# Patient Record
Sex: Female | Born: 1995 | Race: Black or African American | Hispanic: No | Marital: Single | Smoking: Never smoker
Health system: Southern US, Community
[De-identification: ages and names within clinical notes are randomized; demographics above are authoritative.]

## PROBLEM LIST (undated history)

## (undated) DIAGNOSIS — T7840XA Allergy, unspecified, initial encounter: Secondary | ICD-10-CM

## (undated) HISTORY — DX: Allergy, unspecified, initial encounter: T78.40XA

---
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
## 2013-04-03 ENCOUNTER — Ambulatory Visit (INDEPENDENT_AMBULATORY_CARE_PROVIDER_SITE_OTHER): Payer: 59 | Admitting: Physician Assistant

## 2013-04-03 VITALS — BP 115/78 | HR 82 | Temp 98.4°F | Resp 16 | Ht 67.0 in | Wt 139.0 lb

## 2013-04-03 DIAGNOSIS — Z0000 Encounter for general adult medical examination without abnormal findings: Secondary | ICD-10-CM

## 2013-04-03 NOTE — Progress Notes (Signed)
Patient ID: Joann Nelson MRN: [id number], DOB: [birth year], 17 y.o. Date of Encounter: 04/03/2013, 10:29 AM  Primary Physician: Vernell Morgans, MD  Chief Complaint: Physical (CPE)  HPI: 17 y.o. female with history of noted below here for CPE. Doing well. No issues/complaints. Generally healthy. She does have some under lying allergic rhinitis that she takes Claritin for. This seems to help with her symptoms quite well. As a pediatric patient her MD was watching a mild curvature of her spine. No procedures were put into place. She does not have issues with this currently. Only if she carries a heavy load in her book bag, otherwise she does fine. She plans to start exercising regularly once school starts. She was very busy in an early college program and did not have time for this. No ETOH, tobacco, or drugs. She will be enrolling at [employer] in August. Plans to major in Psychology. Plans to go through PhD.    LMP: 03/20/13  Review of Systems: Consitutional: No fever, chills, fatigue, night sweats, lymphadenopathy, or weight changes. Eyes: No visual changes, eye redness, or discharge. ENT/Mouth: Ears: No otalgia, tinnitus, hearing loss, discharge. Nose: No congestion, rhinorrhea, sinus pain, or epistaxis. Throat: No sore throat, post nasal drip, or teeth pain. Cardiovascular: No CP, palpitations, diaphoresis, DOE, edema, orthopnea, PND. Respiratory: No cough, hemoptysis, SOB, or wheezing. Gastrointestinal: No anorexia, dysphagia, reflux, pain, nausea, vomiting, hematemesis, diarrhea, constipation, BRBPR, or melena. Breast: No discharge, pain, swelling, or mass. Genitourinary: No dysuria, frequency, urgency, hematuria, incontinence, nocturia, amenorrhea, vaginal discharge, pruritis, burning, abnormal bleeding, or pain. Musculoskeletal: No decreased ROM, myalgias, stiffness, joint swelling, or weakness. Skin: No rash, erythema, lesion changes, pain, warmth, jaundice, or pruritis. Neurological: No  headache, dizziness, syncope, seizures, tremors, memory loss, coordination problems, or paresthesias. Psychological: No anxiety, depression, hallucinations, SI/HI. Endocrine: No fatigue, polydipsia, polyphagia, polyuria, or known diabetes.   Past Medical History  Diagnosis Date  . Allergy      History reviewed. No pertinent past surgical history.  Home Meds:  Prior to Admission medications   Medication Sig Start Date End Date Taking? Authorizing Provider  loratadine (CLARITIN) 10 MG tablet Take 10 mg by mouth daily.     Yes Historical Provider, MD    Allergies: No Known Allergies  History   Social History  . Marital Status: Single    Spouse Name: N/A    Number of Children: N/A  . Years of Education: N/A   Occupational History  . Not on file.   Social History Main Topics  . Smoking status: Never Smoker   . Smokeless tobacco: Never Used  . Alcohol Use: No  . Drug Use: No  . Sexually Active: No   Other Topics Concern  . Not on file   Social History Narrative  . No narrative on file    Family History  Problem Relation Age of Onset  . Hypertension Mother   . Hypertension Father   . Diabetes Father   . Allergies Father   . Cancer Maternal Grandmother     Breast  . Kidney disease Paternal Grandmother   . Heart disease Paternal Grandfather     Physical Exam: Blood pressure 115/78, pulse 82, temperature 98.4 F (36.9 C), temperature source Oral, resp. rate 16, height 5\' 7"  (1.702 m), weight 139 lb (63.05 kg), last menstrual period 03/20/2013., Body mass index is 21.77 kg/(m^2). General: Well developed, well nourished, in no acute distress. HEENT: Normocephalic, atraumatic. Conjunctiva pink, sclera non-icteric. Pupils 2 mm constricting to 1  mm, round, regular, and equally reactive to light and accomodation. EOMI. Internal auditory canal clear. TMs with good cone of light and without pathology. Nasal mucosa pink. Nares are without discharge. No sinus tenderness. Oral  mucosa pink. Dentition normal. Pharynx without exudate.   Neck: Supple. Trachea midline. No thyromegaly. Full ROM. No lymphadenopathy. Lungs: Clear to auscultation bilaterally without wheezes, rales, or rhonchi. Breathing is of normal effort and unlabored. Cardiovascular: RRR with S1 S2. No murmurs, rubs, or gallops appreciated. Distal pulses 2+ symmetrically. No carotid or abdominal bruits. Abdomen: Soft, non-tender, non-distended with normoactive bowel sounds. No hepatosplenomegaly or masses. No rebound/guarding. No CVA tenderness. Without hernias.  Musculoskeletal: Full range of motion and 5/5 strength throughout. Without swelling, atrophy, tenderness, crepitus, or warmth. Extremities without clubbing, cyanosis, or edema. Calves supple. Skin: Warm and moist without erythema, ecchymosis, wounds, or rash. Neuro: A+Ox3. CN II-XII grossly intact. Moves all extremities spontaneously. Full sensation throughout. Normal gait. DTR 2+ throughout upper and lower extremities. Finger to nose intact. Psych:  Responds to questions appropriately with a normal affect.     Assessment/Plan:  17 y.o. female here for CPE with college form completion -Healthy young adult female physical -Immunizations up to date -Form completed -Declines TDaP today -Healthy diet and exercise -Anticipatory guidance   Signed, Eula Listen, PA-C 04/03/2013 10:29 AM

## 2013-04-07 ENCOUNTER — Ambulatory Visit: Payer: Self-pay | Admitting: Physician Assistant

## 2016-10-16 DIAGNOSIS — L910 Hypertrophic scar: Secondary | ICD-10-CM | POA: Diagnosis not present

## 2016-11-13 DIAGNOSIS — L910 Hypertrophic scar: Secondary | ICD-10-CM | POA: Diagnosis not present

## 2017-02-02 ENCOUNTER — Encounter: Payer: Self-pay | Admitting: Family Medicine

## 2017-02-02 ENCOUNTER — Ambulatory Visit (INDEPENDENT_AMBULATORY_CARE_PROVIDER_SITE_OTHER): Payer: Commercial Managed Care - HMO | Admitting: Family Medicine

## 2017-02-02 VITALS — BP 118/70 | HR 74 | Resp 12 | Ht 67.0 in | Wt 169.0 lb

## 2017-02-02 DIAGNOSIS — J309 Allergic rhinitis, unspecified: Secondary | ICD-10-CM | POA: Diagnosis not present

## 2017-02-02 DIAGNOSIS — H6121 Impacted cerumen, right ear: Secondary | ICD-10-CM

## 2017-02-02 DIAGNOSIS — E049 Nontoxic goiter, unspecified: Secondary | ICD-10-CM

## 2017-02-02 MED ORDER — MONTELUKAST SODIUM 10 MG PO TABS: 10.0000 mg | ORAL_TABLET | Freq: Every day | ORAL | 3 refills | Status: DC

## 2017-02-02 MED ORDER — FLUTICASONE PROPIONATE 50 MCG/ACT NA SUSP: 1.0000 | Freq: Two times a day (BID) | NASAL | 3 refills | Status: DC

## 2017-02-02 NOTE — Progress Notes (Signed)
Pre visit review using our clinic review tool, if applicable. No additional management support is needed unless otherwise documented below in the visit note. 

## 2017-02-02 NOTE — Patient Instructions (Signed)
A few things to remember from today's visit:   Allergic rhinitis, unspecified seasonality, unspecified trigger - Plan: montelukast (SINGULAIR) 10 MG tablet, fluticasone (FLONASE) 50 MCG/ACT nasal spray  Enlarged thyroid gland - Plan: TSH, US THYROID  There are 2 forms of allergic rhinitis: . Seasonal (hay fever): Caused by an allergy to pollen and/or mold spores in the air. Pollen is the fine powder that comes from the stamen of flowering plants. It can be carried through the air and is easily inhaled. Symptoms are seasonal and usually occur in spring, late summer, and fall. Marland Kitchen Perennial: Caused by other allergens such as dust mites, pet hair or dander, or mold. Symptoms occur year-round.  Symptoms: Your symptoms can vary, depending on the severity of your allergies. Symptoms can include: Sneezing, coughing.itching (mostly eyes, nose, mouth, throat and skin),runny nose,stuffy nose.headache,pressure in the nose and cheeks,ear fullness and popping, sore throat.watery, red, or swollen eyes,dark circles under your eyes,trouble smelling, and sometimes hives.  Allergic rhinitis cannot be prevented. You can help your symptoms by avoiding the things that you are allergic, including: . Keeping windows closed. This is especially important during high-pollen seasons. . Washing your hands after petting animals. . Using dust- and mite-proof bedding and mattress covers. . Wearing glasses outside to protect your eyes. . Showering before bed to wash off allergens from hair and skin. You can also avoid things that can make your symptoms worse, such as: . aerosol sprays . air pollution . cold temperatures . humidity . irritating fumes . tobacco smoke . wind . wood smoke.   Antihistamines help reduce the sneezing, runny nose, and itchiness of allergies. These come in pill form and as nasal sprays. Allegra,Zyrtec,or Claritin are some examples. Decongestants, such as pseudoephedrine and phenylephrine, help  temporarily relieve the stuffy nose of allergies. Decongestants are found in many medicines and come as pills, nose sprays, and nose drops. They could increase heart rate and cause tachycardia and tremor. Nasal Afrin should not be used for more than 3 days because you can become dependent on them. This causes you to feel even more stopped-up when you try to quit using them.  Nasal sprays: steroids or antihistaminics. Over the counter intranasal sterids: Nasocort,Rhinocort,or Flonase.You won't notice their benefits for up to 2 weeks after starting them. Allergy shots or sublingual tablets when other treatment do not help.This is done by immunologists.    Please be sure medication list is accurate. If a new problem present, please set up appointment sooner than planned today.

## 2017-02-02 NOTE — Progress Notes (Signed)
HPI:   [name] is a 21 y.o. female, who is here today with her mother to establish care.  Former PCP: N/A Last preventive routine visit: 03/2013  Chronic medical problems: Otherwise healthy except for Hx of allergies.   Concerns today:   Allergies: For the past couple weeks she has had constant rhinorrhea,nasal congestion, post nasal drainage, pruritic eyes and throat. She is currently on Claritin 10 mg daily and has taken it since age 72-5. She has used Afrin for 3 days first and repeated 2 week latter for 2 days, the second time did not help much. She has not identified exacerbating factors. Feels like symptoms are getting worse.  She denies fever, sore throat, facial pain, dysphagia, cough,wheezing, or rash.  She has symptoms all year long. No Hx of asthma.  Vaccines up to date, per mother report.  -On examination today noted enlarged thyroid gland.  She has not noted dysphagia, palpitations, abdominal pain, changes in bowel habits, tremor, cold/heat intolerance, or abnormal weight loss.   Review of Systems  Constitutional: Negative for activity change, appetite change, fatigue and fever.  HENT: Positive for congestion, nosebleeds, postnasal drip, rhinorrhea and sneezing. Negative for ear pain, mouth sores, sinus pressure, sore throat, trouble swallowing and voice change.   Eyes: Positive for itching. Negative for discharge and redness.  Respiratory: Negative for cough, shortness of breath and wheezing.   Cardiovascular: Negative for leg swelling.  Gastrointestinal: Negative for abdominal pain, nausea and vomiting.       No changes in bowel habits.  Endocrine: Negative for cold intolerance and heat intolerance.  Musculoskeletal: Negative for myalgias and neck pain.  Skin: Negative for pallor and rash.  Allergic/Immunologic: Positive for environmental allergies.  Neurological: Negative for weakness and headaches.  Hematological: Negative for adenopathy.  Does not bruise/bleed easily.      Current Outpatient Prescriptions on File Prior to Visit  Medication Sig Dispense Refill  . loratadine (CLARITIN) 10 MG tablet Take 10 mg by mouth daily.       No current facility-administered medications on file prior to visit.      Past Medical History:  Diagnosis Date  . Allergy    No Known Allergies  Family History  Problem Relation Age of Onset  . Hypertension Mother   . Hypertension Father   . Diabetes Father   . Allergies Father   . Cancer Maternal Grandmother     Breast  . Kidney disease Paternal Grandmother   . Arthritis Paternal Grandmother   . Diabetes Paternal Grandmother   . Heart disease Paternal Grandfather   . Diabetes Paternal Grandfather     Social History   Social History  . Marital status: Single    Spouse name: N/A  . Number of children: N/A  . Years of education: N/A   Social History Main Topics  . Smoking status: Never Smoker  . Smokeless tobacco: Never Used  . Alcohol use No  . Drug use: No  . Sexual activity: No   Other Topics Concern  . None   Social History Narrative  . None    Vitals:   02/02/17 0849  BP: 118/70  Pulse: 74  Resp: 12  O2 sat at RA 98%  Body mass index is 26.47 kg/m.  Physical Exam  Nursing note and vitals reviewed. Constitutional: She is oriented to person, place, and time. She appears well-developed and well-nourished. She does not appear ill. No distress.  HENT:  Head: Atraumatic.  Right Ear: Hearing and external ear normal.  Left Ear: Tympanic membrane, external ear and ear canal normal.  Nose: Rhinorrhea and septal deviation present. Right sinus exhibits no maxillary sinus tenderness and no frontal sinus tenderness. Left sinus exhibits no maxillary sinus tenderness and no frontal sinus tenderness.  Mouth/Throat: Oropharynx is clear and moist and mucous membranes are normal.  Cerumen excess right, could not see TM. Hypertrophic turbinates. Post nasal  drainage. Trace of blood right nasal passage on septum.  Eyes: Conjunctivae and EOM are normal. Pupils are equal, round, and reactive to light.  Neck: No muscular tenderness present. No edema and no erythema present. Thyromegaly (? Right nodule.) present.  Cardiovascular: Normal rate and regular rhythm.   No murmur heard. Pulses:      Dorsalis pedis pulses are 2+ on the right side, and 2+ on the left side.  Respiratory: Effort normal and breath sounds normal. No stridor. No respiratory distress.  GI: Soft. She exhibits no mass. There is no hepatomegaly. There is no tenderness.  Musculoskeletal: She exhibits no edema or tenderness.  Lymphadenopathy:    She has no cervical adenopathy.  Neurological: She is alert and oriented to person, place, and time. She has normal strength. Gait normal.  Skin: Skin is warm. No rash noted. No erythema.  Psychiatric: She has a normal mood and affect. Her speech is normal.  Well groomed, good eye contact.     ASSESSMENT AND PLAN:    Joann Nelson was seen today for establish care.  Diagnoses and all orders for this visit:  Allergic rhinitis, unspecified seasonality, unspecified trigger  Educated about Dx and treatment options. Continue Claritin 10 mg daily, today Flonase nasal spray and Singulair 10 mg added. Nasal irrigations with saline as needed. F/U in 2 months.  -     montelukast (SINGULAIR) 10 MG tablet; Take 1 tablet (10 mg total) by mouth at bedtime. -     fluticasone (FLONASE) 50 MCG/ACT nasal spray; Place 1 spray into both nostrils 2 (two) times daily.  Enlarged thyroid gland  ? Right thyroid nodule. Finding on examination discussed. Further recommendations will be given according to labs/imaging results.  -     TSH -     US THYROID; Future  Impacted cerumen of right ear  Asymptomatic. Avoid Q tips, which she is currently using. OTC Debrox in right ear may help.     Tien Spooner G. [place], MD  [facility]. [place]  office.

## 2017-02-12 ENCOUNTER — Ambulatory Visit
Admission: RE | Admit: 2017-02-12 | Discharge: 2017-02-12 | Disposition: A | Discharge status: Home / Self Care | Payer: Commercial Managed Care - HMO | Source: Ambulatory Visit | Attending: Family Medicine | Admitting: Family Medicine

## 2017-02-12 DIAGNOSIS — E049 Nontoxic goiter, unspecified: Secondary | ICD-10-CM

## 2017-02-12 DIAGNOSIS — E042 Nontoxic multinodular goiter: Secondary | ICD-10-CM | POA: Diagnosis not present

## 2017-04-08 NOTE — Progress Notes (Signed)
HPI:   [name] is a 21 y.o. female, who is here today with her mother to follow on recent OV.   She was seen on 02/02/17, when she was c/o severe allergic rhinitis symptoms. She was started on Singulair and Flonase nasal spray, continued on Claritin 10 mg daily.   Overall she is feeling greatly better, most symptoms resolved. Mild intermittent nasal congestion but mild. She has had nausea a couple times and she thinks it is caused by Singulair. Rest of meds she has tolerated well.   Mother also would like to go through thyroid US results, which was done on 02/12/17 because enlarged thyroid gland. 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   She  has not noted dysphagia, palpitations, abdominal pain, changes in bowel habits, tremor, cold/heat intolerance, or abnormal weight loss.   She is planning on moving to [place], where she will be for a year.   Review of Systems  Constitutional: Negative for activity change, appetite change, fatigue and fever.  HENT: Positive for congestion. Negative for mouth sores, nosebleeds, sinus pain, sore throat and trouble swallowing.   Eyes: Negative for discharge, redness and itching.  Respiratory: Negative for cough, chest tightness, shortness of breath and wheezing.   Cardiovascular: Negative for palpitations and leg swelling.  Gastrointestinal: Negative for abdominal pain and vomiting.  Endocrine: Negative for cold intolerance and heat intolerance.  Musculoskeletal: Negative for back pain and myalgias.  Skin: Negative for rash.  Allergic/Immunologic: Positive for environmental allergies.  Neurological: Negative for syncope, weakness and headaches.  Hematological: Negative for adenopathy. Does not bruise/bleed easily.  Psychiatric/Behavioral: Negative for confusion and sleep disturbance.      Current Outpatient Prescriptions on File Prior to Visit    Medication Sig Dispense Refill  . loratadine (CLARITIN) 10 MG tablet Take 10 mg by mouth daily.       No current facility-administered medications on file prior to visit.      Past Medical History:  Diagnosis Date  . Allergy    No Known Allergies  Social History   Social History  . Marital status: Single    Spouse name: N/A  . Number of children: N/A  . Years of education: N/A   Social History Main Topics  . Smoking status: Never Smoker  . Smokeless tobacco: Never Used  . Alcohol use No  . Drug use: No  . Sexual activity: No   Other Topics Concern  . None   Social History Narrative  . None    Vitals:   04/09/17 1046  BP: 110/80  Pulse: 68  Resp: 12   Body mass index is 26.03 kg/m.   Physical Exam  Nursing note and vitals reviewed. Constitutional: She is oriented to person, place, and time. She appears well-developed. She does not appear ill. No distress.  HENT:  Head: Atraumatic.  Left Ear: Tympanic membrane normal.  Nose: Right sinus exhibits no maxillary sinus tenderness and no frontal sinus tenderness. Left sinus exhibits no maxillary sinus tenderness and no frontal sinus tenderness.  Mouth/Throat: Oropharynx is clear and moist and mucous membranes are normal.  Hypertrophic turbinates.  Eyes: Conjunctivae are normal.  Neck: No tracheal deviation present. Thyromegaly present. No thyroid mass present.  Cardiovascular: Normal rate and regular rhythm.   No murmur heard. Respiratory: Effort normal and breath sounds normal. No respiratory distress.  Lymphadenopathy:    She has no cervical adenopathy.  Neurological: She is alert and oriented to person, place, and time. She has normal strength. Gait normal.  Skin: Skin is warm. No rash noted. No erythema.  Psychiatric: She has a normal mood and affect.  Well groomed, good eye contact.    ASSESSMENT AND PLAN:   Joann Nelson was seen today for follow-up.  Diagnoses and all orders for this visit:  Allergic  rhinitis, unspecified seasonality, unspecified trigger  Improved. Some side effects of medications discussed. She could try decreasing Singulair dose to 1/2 tab and monitor for symptoms changes and/or nausea occurrence. [place]she may not need daily meds while she is there, she could try stopping Singulair and continue other meds prn. F/U annually.   -     montelukast (SINGULAIR) 10 MG tablet; Take 1 tablet (10 mg total) by mouth at bedtime. -     fluticasone (FLONASE) 50 MCG/ACT nasal spray; Place 1 spray into both nostrils 2 (two) times daily.  Thyromegaly  Thyroid US reviewed. Reassured. Further recommendations will be given according to lab results.  -     TSH      Cardelia Sassano G. [place], MD  [facility]. [place] office.

## 2017-04-09 ENCOUNTER — Encounter: Payer: Self-pay | Admitting: Family Medicine

## 2017-04-09 ENCOUNTER — Ambulatory Visit (INDEPENDENT_AMBULATORY_CARE_PROVIDER_SITE_OTHER): Payer: 59 | Admitting: Family Medicine

## 2017-04-09 VITALS — BP 110/80 | HR 68 | Resp 12 | Ht 67.0 in | Wt 166.2 lb

## 2017-04-09 DIAGNOSIS — J309 Allergic rhinitis, unspecified: Secondary | ICD-10-CM | POA: Diagnosis not present

## 2017-04-09 DIAGNOSIS — E010 Iodine-deficiency related diffuse (endemic) goiter: Secondary | ICD-10-CM

## 2017-04-09 LAB — TSH: TSH: 0.48 u[IU]/mL (ref 0.35–4.50)

## 2017-04-09 MED ORDER — FLUTICASONE PROPIONATE 50 MCG/ACT NA SUSP: 1.0000 | Freq: Two times a day (BID) | NASAL | 1 refills | Status: DC

## 2017-04-09 MED ORDER — MONTELUKAST SODIUM 10 MG PO TABS: 10.0000 mg | ORAL_TABLET | Freq: Every day | ORAL | 2 refills | Status: DC

## 2017-04-09 NOTE — Patient Instructions (Signed)
A few things to remember from today's visit:   Allergic rhinitis, unspecified seasonality, unspecified trigger - Plan: montelukast (SINGULAIR) 10 MG tablet, fluticasone (FLONASE) 50 MCG/ACT nasal spray  Thyromegaly - Plan: TSH  There are 2 forms of allergic rhinitis: . Seasonal (hay fever): Caused by an allergy to pollen and/or mold spores in the air. Pollen is the fine powder that comes from the stamen of flowering plants. It can be carried through the air and is easily inhaled. Symptoms are seasonal and usually occur in spring, late summer, and fall. Marland Kitchen. Perennial: Caused by other allergens such as dust mites, pet hair or dander, or mold. Symptoms occur year-round.  Symptoms: Your symptoms can vary, depending on the severity of your allergies. Symptoms can include: Sneezing, coughing.itching (mostly eyes, nose, mouth, throat and skin),runny nose,stuffy nose.headache,pressure in the nose and cheeks,ear fullness and popping, sore throat.watery, red, or swollen eyes,dark circles under your eyes,trouble smelling, and sometimes hives.  Allergic rhinitis cannot be prevented. You can help your symptoms by avoiding the things that you are allergic, including: . Keeping windows closed. This is especially important during high-pollen seasons. . Washing your hands after petting animals. . Using dust- and mite-proof bedding and mattress covers. . Wearing glasses outside to protect your eyes. . Showering before bed to wash off allergens from hair and skin. You can also avoid things that can make your symptoms worse, such as: . aerosol sprays . air pollution . cold temperatures . humidity . irritating fumes . tobacco smoke . wind . wood smoke.   Antihistamines help reduce the sneezing, runny nose, and itchiness of allergies. These come in pill form and as nasal sprays. Allegra,Zyrtec,or Claritin are some examples. Decongestants, such as pseudoephedrine and phenylephrine, help temporarily relieve  the stuffy nose of allergies. Decongestants are found in many medicines and come as pills, nose sprays, and nose drops. They could increase heart rate and cause tachycardia and tremor. Nasal Afrin should not be used for more than 3 days because you can become dependent on them. This causes you to feel even more stopped-up when you try to quit using them.  Nasal sprays: steroids or antihistaminics. Over the counter intranasal sterids: Nasocort,Rhinocort,or Flonase.You won't notice their benefits for up to 2 weeks after starting them. Allergy shots or sublingual tablets when other treatment do not help.This is done by immunologists.   Please be sure medication list is accurate. If a new problem present, please set up appointment sooner than planned today.

## 2018-03-27 ENCOUNTER — Other Ambulatory Visit: Payer: Self-pay | Admitting: Family Medicine

## 2018-03-27 DIAGNOSIS — J309 Allergic rhinitis, unspecified: Secondary | ICD-10-CM

## 2018-03-31 ENCOUNTER — Other Ambulatory Visit: Payer: Self-pay | Admitting: Family Medicine

## 2018-03-31 DIAGNOSIS — J309 Allergic rhinitis, unspecified: Secondary | ICD-10-CM

## 2018-04-04 IMAGING — US US THYROID
1 series · 14 of 25 positions shown · non-contrast
Comparison: None.

CLINICAL DATA: Asymptomatic thyromegaly

EXAM:
THYROID ULTRASOUND
TECHNIQUE: Ultrasound examination of the thyroid gland and adjacent soft
tissues was performed.

[Series 1: us thyroid · 0.05mm/px · 14 of 54 slices shown]
[im 1/54]
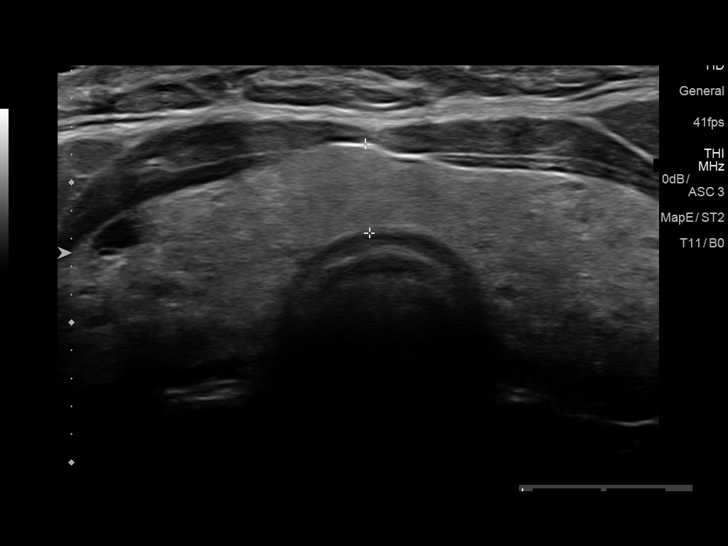
[im 5/54]
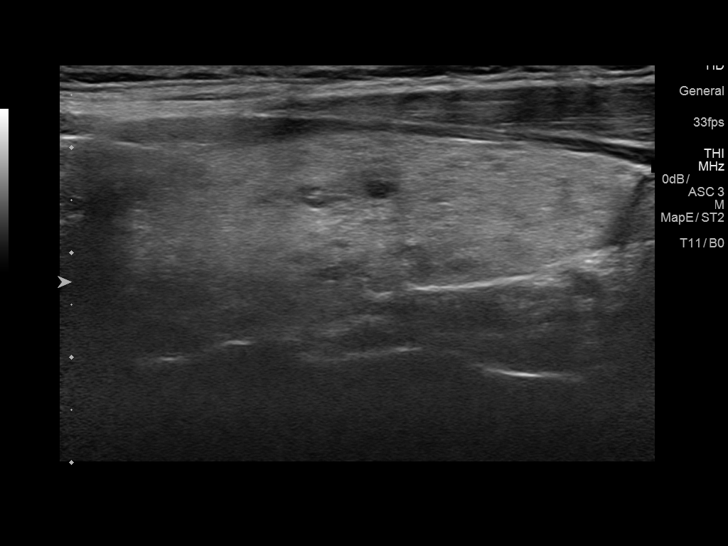
[im 9/54]
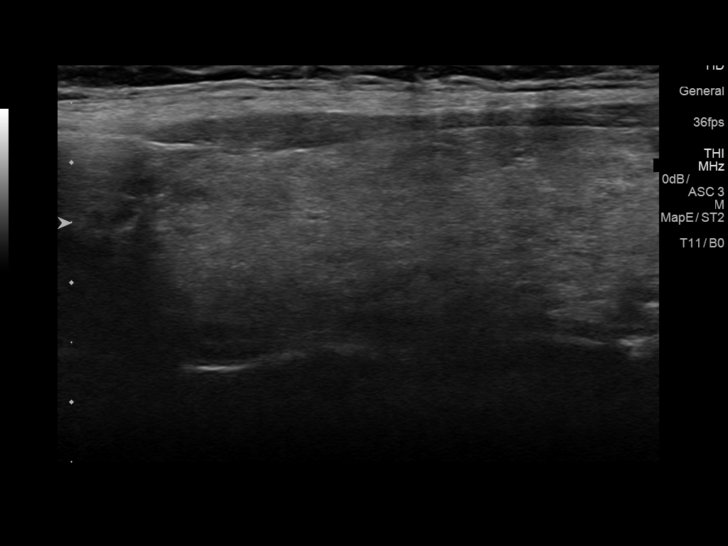
[im 14/54]
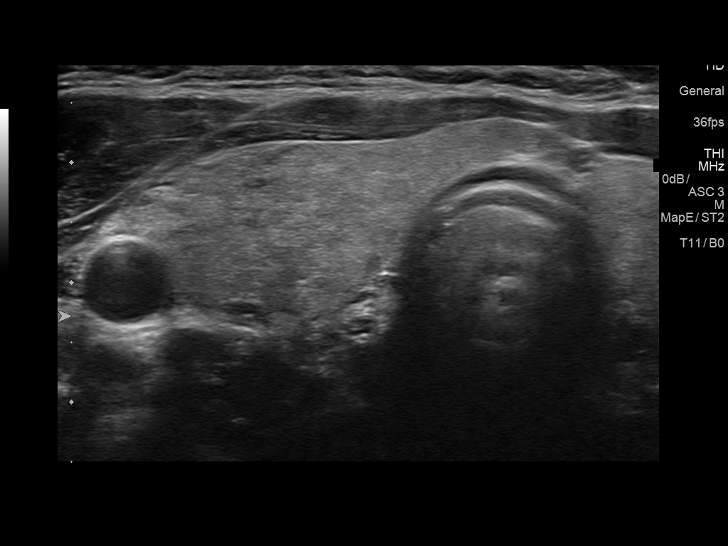
[im 18/54]
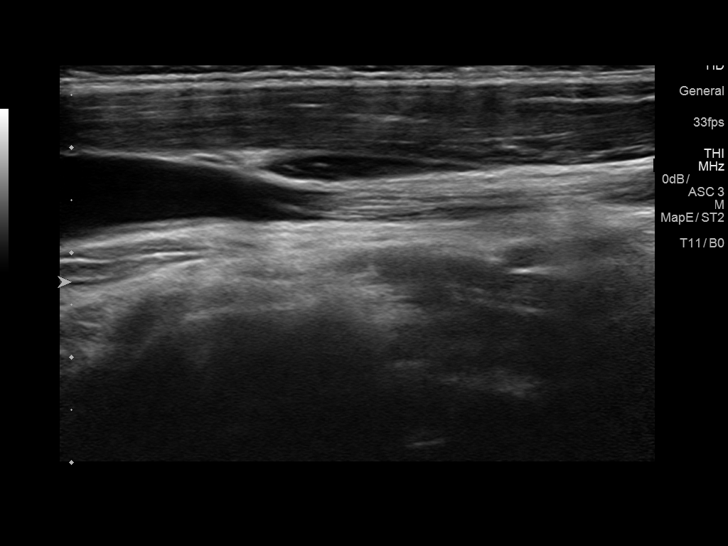
[im 20/54]
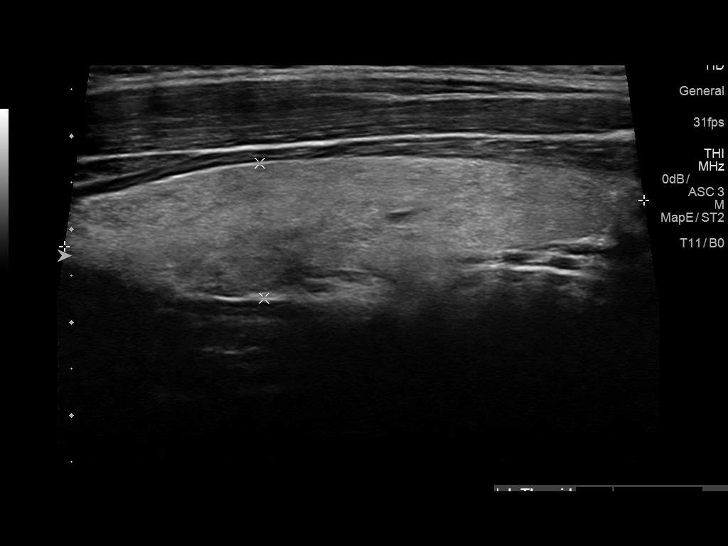
[im 25/54]
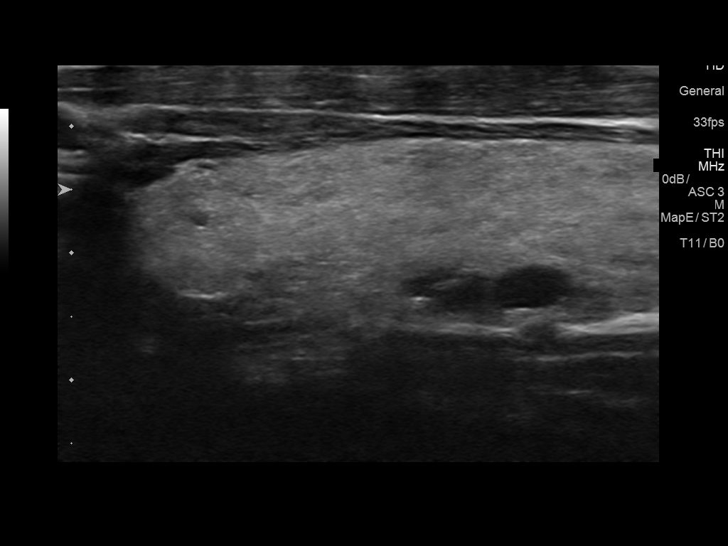
[im 29/54]
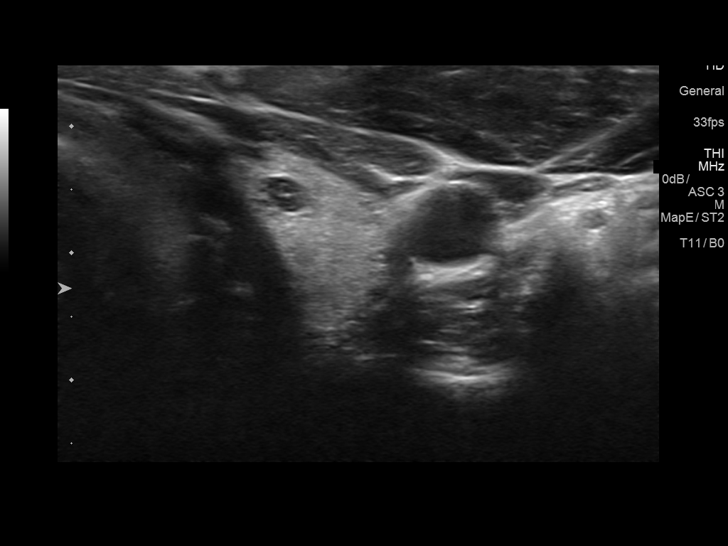
[im 34/54]
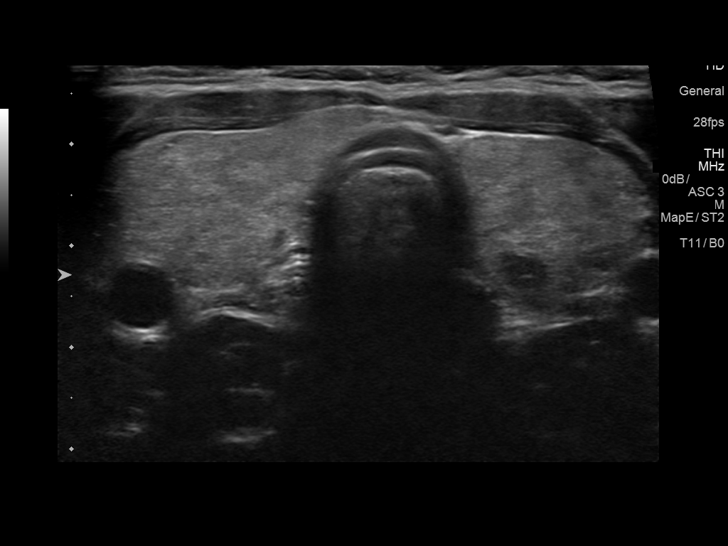
[im 36/54]
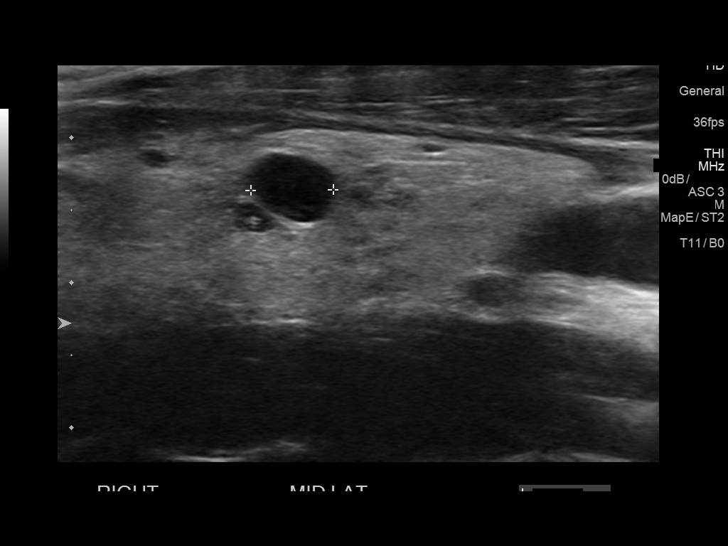
[im 40/54]
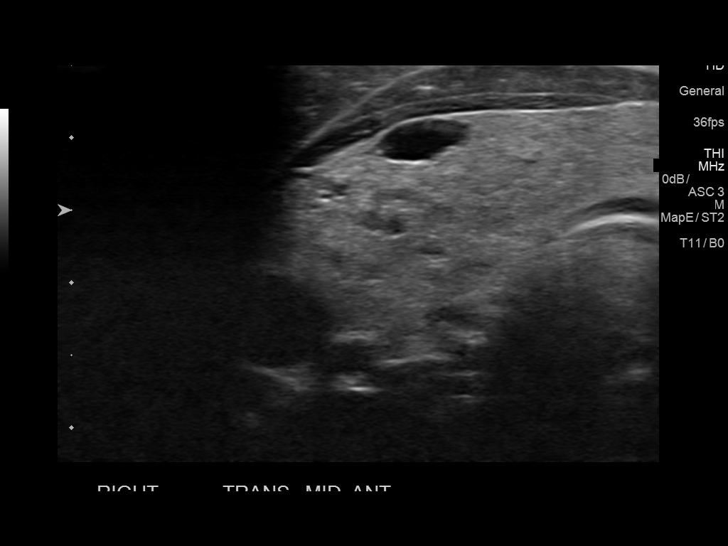
[im 45/54]
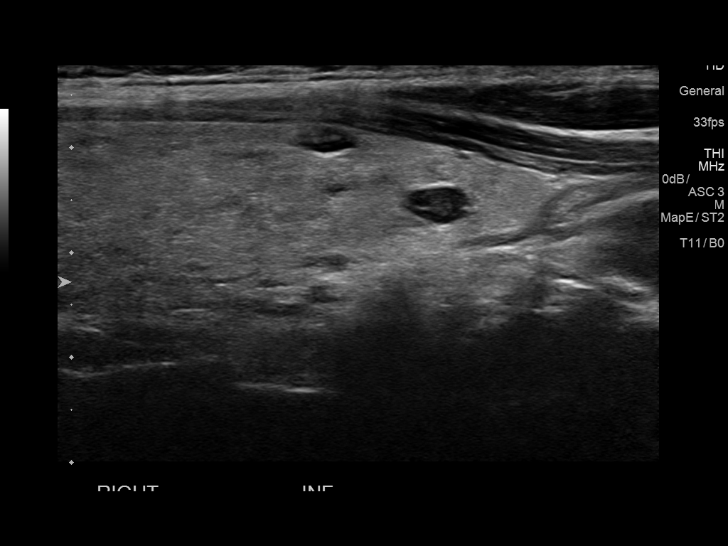
[im 49/54]
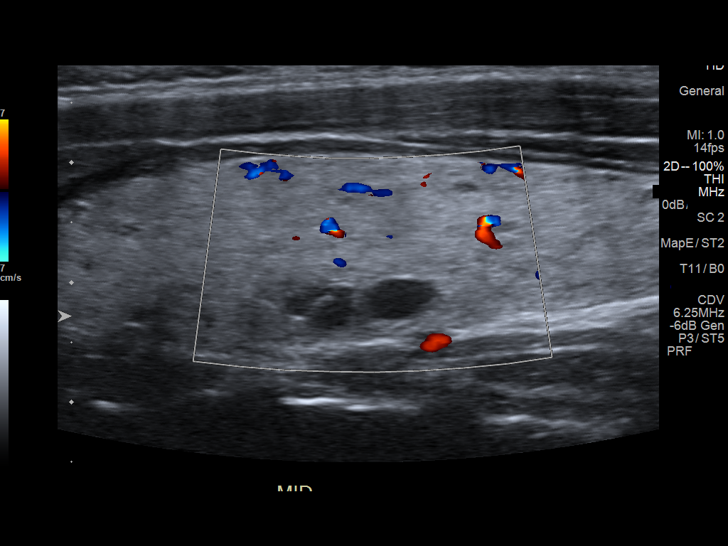
[im 54/54]
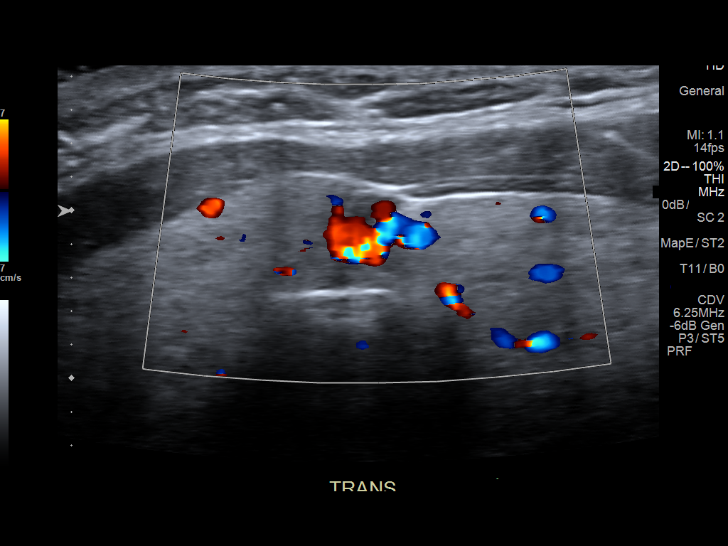

[14 of 25 positions shown; findings below may reference images not displayed]

FINDINGS: Parenchymal Echotexture: Mildly heterogenous

Isthmus: 0.6 cm thickness

Right lobe: 6.4 x 1.6 x 2.2 cm

Left lobe: 6.2 x 1.5 x 1.9 cm

_________________________________________________________

Estimated total number of nodules >/= 1 cm: 0

Number of spongiform nodules >/=  2 cm not described below (TR1): 0

Number of mixed cystic and solid nodules >/= 1.5 cm not described
below (TR2):

_________________________________________________________

Scattered bilateral cysts and hypoechoic nodules, without
microcalcifications, all less than 7 mm.
IMPRESSION: 1. Thyromegaly with scattered small cysts and nodules, none of which
meets criteria for biopsy or dedicated imaging follow-up.

The above is in keeping with the ACR TI-RADS recommendations - [HOSPITAL] 6624;[DATE].

## 2018-04-25 ENCOUNTER — Other Ambulatory Visit: Payer: Self-pay | Admitting: Family Medicine

## 2018-04-25 DIAGNOSIS — J309 Allergic rhinitis, unspecified: Secondary | ICD-10-CM

## 2018-04-25 NOTE — Telephone Encounter (Signed)
Called patient and left message to return call to schedule CPE. 

## 2018-05-09 ENCOUNTER — Other Ambulatory Visit: Payer: Self-pay | Admitting: Family Medicine

## 2018-05-09 DIAGNOSIS — J309 Allergic rhinitis, unspecified: Secondary | ICD-10-CM

## 2018-05-09 NOTE — Telephone Encounter (Signed)
Patient need to schedule an ov for more refills. 

## 2018-05-10 NOTE — Telephone Encounter (Signed)
Left message for patient to call office to come in for medication refills. Last OV a year ago.

## 2018-09-30 ENCOUNTER — Encounter: Payer: Self-pay | Admitting: Family Medicine

## 2018-09-30 ENCOUNTER — Ambulatory Visit (INDEPENDENT_AMBULATORY_CARE_PROVIDER_SITE_OTHER): Payer: 59 | Admitting: Family Medicine

## 2018-09-30 VITALS — BP 116/78 | HR 94 | Temp 98.2°F | Resp 12 | Ht 67.0 in | Wt 159.1 lb

## 2018-09-30 DIAGNOSIS — Z0000 Encounter for general adult medical examination without abnormal findings: Secondary | ICD-10-CM

## 2018-09-30 DIAGNOSIS — Z1329 Encounter for screening for other suspected endocrine disorder: Secondary | ICD-10-CM

## 2018-09-30 DIAGNOSIS — Z23 Encounter for immunization: Secondary | ICD-10-CM

## 2018-09-30 DIAGNOSIS — Z0189 Encounter for other specified special examinations: Secondary | ICD-10-CM | POA: Diagnosis not present

## 2018-09-30 DIAGNOSIS — Z13220 Encounter for screening for lipoid disorders: Secondary | ICD-10-CM

## 2018-09-30 DIAGNOSIS — Z13228 Encounter for screening for other metabolic disorders: Secondary | ICD-10-CM | POA: Diagnosis not present

## 2018-09-30 DIAGNOSIS — N920 Excessive and frequent menstruation with regular cycle: Secondary | ICD-10-CM

## 2018-09-30 DIAGNOSIS — J309 Allergic rhinitis, unspecified: Secondary | ICD-10-CM

## 2018-09-30 DIAGNOSIS — Z008 Encounter for other general examination: Secondary | ICD-10-CM

## 2018-09-30 DIAGNOSIS — Z130 Encounter for screening for diseases of the blood and blood-forming organs and certain disorders involving the immune mechanism: Secondary | ICD-10-CM | POA: Diagnosis not present

## 2018-09-30 LAB — LIPID PANEL
CHOL/HDL RATIO: 2
Cholesterol: 136 mg/dL (ref 0–200)
HDL: 58.5 mg/dL (ref >39.00)
LDL CALC: 68 mg/dL (ref 0–99)
NonHDL: 77.93
TRIGLYCERIDES: 48 mg/dL (ref 0.0–149.0)
VLDL: 9.6 mg/dL (ref 0.0–40.0)

## 2018-09-30 LAB — CBC
HCT: 35.7 % — ABNORMAL LOW (ref 36.0–46.0)
Hemoglobin: 11.7 g/dL — ABNORMAL LOW (ref 12.0–15.0)
MCHC: 32.7 g/dL (ref 30.0–36.0)
MCV: 85.4 fl (ref 78.0–100.0)
PLATELETS: 224 10*3/uL (ref 150.0–400.0)
RBC: 4.18 Mil/uL (ref 3.87–5.11)
RDW: 14.7 % (ref 11.5–15.5)
WBC: 3.9 10*3/uL — ABNORMAL LOW (ref 4.0–10.5)

## 2018-09-30 LAB — BASIC METABOLIC PANEL
BUN: 12 mg/dL (ref 6–23)
CHLORIDE: 104 meq/L (ref 96–112)
CO2: 26 meq/L (ref 19–32)
Calcium: 8.9 mg/dL (ref 8.4–10.5)
Creatinine, Ser: 0.74 mg/dL (ref 0.40–1.20)
GFR: 123.24 mL/min (ref >60.00)
Glucose, Bld: 91 mg/dL (ref 70–99)
POTASSIUM: 3.8 meq/L (ref 3.5–5.1)
SODIUM: 138 meq/L (ref 135–145)

## 2018-09-30 MED ORDER — MONTELUKAST SODIUM 10 MG PO TABS: ORAL_TABLET | ORAL | 3 refills | Status: AC

## 2018-09-30 MED ORDER — FLUTICASONE PROPIONATE 50 MCG/ACT NA SUSP: 1.0000 | Freq: Two times a day (BID) | NASAL | 4 refills | Status: AC

## 2018-09-30 NOTE — Progress Notes (Signed)
HPI:   Joann Nelson is a 22 y.o. female, who is here today with her mother for her routine physical.  Last CPE: 10/2015  Regular exercise 3 or more time per week: Not consistently. Following a healthful diet: She has not been consistent. She lives alone. SHe is here visiting her mother, lives in [place].  Chronic medical problems: Allergic rhinitis.  Pap smear: She has not had one. She has not been sexually active.  G:0 LMP 09/25/18. She has heavy menses with "bad" cramps.  Birth control:N/A.  Immunization History  Administered Date(s) Administered  . DTaP 03/03/1994, 05/03/1994, 06/28/1994, 03/27/1995, 11/30/1998  . HPV Quadrivalent 10/23/2005, 01/10/2006, 05/21/2006  . Hepatitis A 01/14/2008, 04/02/2013  . Hepatitis B August [birth year] 1994, 03/03/1994, 06/28/1994  . HiB (PRP-OMP) 03/03/1994, 05/03/1994, 06/28/1994, 03/27/1995  . IPV 11/30/1998  . MMR 03/27/1995, 11/30/1998  . Meningococcal Polysaccharide 01/14/2008, 04/02/2013  . OPV 03/03/1994, 05/03/1994, 06/28/1994  . Tdap 10/23/2005  . Varicella 07/11/1999     She has no concerns today.  She is looking for a new job in [place], she is not sure if she may need physical and/or lab work done (biometrics).  Review of Systems  Constitutional: Negative for appetite change, fatigue and fever.  HENT: Negative for dental problem, hearing loss, mouth sores, sore throat, trouble swallowing and voice change.  + Nasal congestion and rhinorrhea,mild. Eyes: Negative for redness and visual disturbance.  Respiratory: Negative for cough, shortness of breath and wheezing.   Cardiovascular: Negative for chest pain and leg swelling.  Gastrointestinal: Negative for abdominal pain, nausea and vomiting.       No changes in bowel habits.  Endocrine: Negative for cold intolerance, heat intolerance, polydipsia, polyphagia and polyuria.  Genitourinary: Negative for decreased urine volume, dysuria, hematuria, vaginal bleeding and vaginal  discharge. + Heavy menses. Musculoskeletal: Negative for arthralgias, gait problem and myalgias.  Skin: Negative for color change and rash.  Allergic/Immunologic: Negative for environmental allergies.  Neurological: Negative for syncope, weakness and headaches.  Hematological: Negative for adenopathy. Does not bruise/bleed easily.  Psychiatric/Behavioral: Negative for confusion and sleep disturbance. The patient is not nervous/anxious.   All other systems reviewed and are negative.     Current Outpatient Medications on File Prior to Visit  Medication Sig Dispense Refill  . loratadine (CLARITIN) 10 MG tablet Take 10 mg by mouth daily.       No current facility-administered medications on file prior to visit.      Past Medical History:  Diagnosis Date  . Allergy     History reviewed. No pertinent surgical history.  No Known Allergies  Family History  Problem Relation Age of Onset  . Hypertension Mother   . Hypertension Father   . Diabetes Father   . Allergies Father   . Cancer Maternal Grandmother        Breast  . Kidney disease Paternal Grandmother   . Arthritis Paternal Grandmother   . Diabetes Paternal Grandmother   . Heart disease Paternal Grandfather   . Diabetes Paternal Grandfather     Social History   Socioeconomic History  . Marital status: Single    Spouse name: Not on file  . Number of children: Not on file  . Years of education: Not on file  . Highest education level: Not on file  Occupational History  . Not on file  Social Needs  . Financial resource strain: Not on file  . Food insecurity:    Worry: Not on file  Inability: Not on file  . Transportation needs:    Medical: Not on file    Non-medical: Not on file  Tobacco Use  . Smoking status: Never Smoker  . Smokeless tobacco: Never Used  Substance and Sexual Activity  . Alcohol use: No  . Drug use: No  . Sexual activity: Never  Lifestyle  . Physical activity:    Days per week: Not on  file    Minutes per session: Not on file  . Stress: Not on file  Relationships  . Social connections:    Talks on phone: Not on file    Gets together: Not on file    Attends religious service: Not on file    Active member of club or organization: Not on file    Attends meetings of clubs or organizations: Not on file    Relationship status: Not on file  Other Topics Concern  . Not on file  Social History Narrative  . Not on file     Vitals:   09/30/18 0840  BP: 116/78  Pulse: 94  Resp: 12  Temp: 98.2 F (36.8 C)  SpO2: 99%   Body mass index is 24.92 kg/m.   Wt Readings from Last 3 Encounters:  09/30/18 159 lb 2 oz (72.2 kg)  04/09/17 166 lb 3 oz (75.4 kg)  02/02/17 169 lb (76.7 kg)     Physical Exam  Nursing note and vitals reviewed. Constitutional: She is oriented to person, place, and time. She appears well-developed and well-nourished. No distress.  HENT:  Head: Normocephalic and atraumatic.  Right Ear: Hearing, tympanic membrane, external ear and ear canal normal.  Left Ear: Hearing, tympanic membrane, external ear and ear canal normal.  Mouth/Throat: Uvula is midline, oropharynx is clear and moist and mucous membranes are normal.  Eyes: Pupils are equal, round, and reactive to light. Conjunctivae and EOM are normal.  Neck: No tracheal deviation present. No thyromegaly present.  Cardiovascular: Normal rate and regular rhythm.  No murmur heard. Pulses:      Dorsalis pedis pulses are 2+ on the right side, and 2+ on the left side.  Respiratory: Effort normal and breath sounds normal. No respiratory distress.  GI: Soft. She exhibits no mass. There is no hepatomegaly. There is no tenderness.  Genitourinary:  Genitourinary Comments: Breast exam with fibrocystic changes bilateral, rest of the examination deferred to gyn.  Musculoskeletal: She exhibits no edema.  No major deformity or signs of synovitis appreciated.  Lymphadenopathy:    She has no cervical  adenopathy.       Right: No supraclavicular adenopathy present.       Left: No supraclavicular adenopathy present.  Neurological: She is alert and oriented to person, place, and time. She has normal strength. No cranial nerve deficit. Coordination and gait normal.  Reflex Scores:      Bicep reflexes are 2+ on the right side and 2+ on the left side.      Patellar reflexes are 2+ on the right side and 2+ on the left side. Skin: Skin is warm. No rash noted. No erythema.  Psychiatric: She has a normal mood and affect. Her speech is normal.  Well groomed, good eye contact.     ASSESSMENT AND PLAN:  Joann Nelson was here today annual physical examination.   Orders Placed This Encounter  Procedures  . Tdap vaccine greater than or equal to 7yo IM  . Basic metabolic panel  . Lipid panel  . CBC  Lab Results  Component Value Date   WBC 3.9 (L) 09/30/2018   HGB 11.7 (L) 09/30/2018   HCT 35.7 (L) 09/30/2018   MCV 85.4 09/30/2018   PLT 224.0 09/30/2018    Lab Results  Component Value Date   CREATININE 0.74 09/30/2018   BUN 12 09/30/2018   NA 138 09/30/2018   K 3.8 09/30/2018   CL 104 09/30/2018   CO2 26 09/30/2018   Lab Results  Component Value Date   CHOL 136 09/30/2018   HDL 58.50 09/30/2018   LDLCALC 68 09/30/2018   TRIG 48.0 09/30/2018   CHOLHDL 2 09/30/2018    Routine general medical examination at a health care facility We discussed the importance of regular physical activity and healthy diet for prevention of chronic illness and/or complications. Preventive guidelines reviewed. Vaccination updated. Refused flu vaccine. She will establish with gynecologist in [place] to start with her female preventive care.  Next CPE in a year.  Menorrhagia with regular cycle OTC Ibuprofen 400-600 mg tid with food may help. Further recommendations will be given according to CBC results.  -     CBC  Screening for lipoid disorders -     Lipid panel  Screening for  endocrine, metabolic and immunity disorder -     Basic metabolic panel  Encounter for biometric screening -     Basic metabolic panel -     Lipid panel  Allergic rhinitis, unspecified seasonality, unspecified trigger Stable. No changes in current management.  -     montelukast (SINGULAIR) 10 MG tablet; TAKE 1 TABLET BY MOUTH EVERYDAY AT BEDTIME -     fluticasone (FLONASE) 50 MCG/ACT nasal spray; Place 1 spray into both nostrils 2 (two) times daily.  Need for Tdap vaccination -     Tdap vaccine greater than or equal to 7yo IM     Return in 1 year (on 10/01/2019) for CPE .      Markus Casten G. Martin[place], MD  [facility]. [place] office.

## 2018-09-30 NOTE — Patient Instructions (Signed)
A few things to remember from today's visit:   Routine general medical examination at a health care facility  Menorrhagia with regular cycle - Plan: CBC  Screening for lipoid disorders - Plan: Lipid panel  Screening for endocrine, metabolic and immunity disorder - Plan: Basic metabolic panel  Encounter for biometric screening  Allergic rhinitis, unspecified seasonality, unspecified trigger - Plan: montelukast (SINGULAIR) 10 MG tablet, fluticasone (FLONASE) 50 MCG/ACT nasal spray  Today you have you routine preventive visit.  At least 150 minutes of moderate exercise per week, daily brisk walking for 15-30 min is a good exercise option. Healthy diet low in saturated (animal) fats and sweets and consisting of fresh fruits and vegetables, lean meats such as fish and white chicken and whole grains.  These are some of recommendations for screening depending of age and risk factors:   - Vaccines:  Tdap vaccine every 10 years.  Shingles vaccine recommended at age 24, could be given after 24 years of age but not sure about insurance coverage.   Pneumonia vaccines:  Prevnar 13 at 65 and Pneumovax at 66. Sometimes Pneumovax is giving earlier if history of smoking, lung disease,diabetes,kidney disease among some.    Screening for diabetes at age 24 and every 3 years.  Cervical cancer prevention:  Pap smear starts at 24 years of age and continues periodically until 24 years old in low risk women. Pap smear every 3 years between 2721 and 24 years old. Pap smear every 3-5 years between women 30 and older if pap smear negative and HPV screening negative.   -Breast cancer: Mammogram: There is disagreement between experts about when to start screening in low risk asymptomatic female but recent recommendations are to start screening at 3240 and not later than 24 years old , every 1-2 years and after 24 yo q 2 years. Screening is recommended until 24 years old but some women can continue screening  depending of healthy issues.   Colon cancer screening: starts at 24 years old until 24 years old.  Cholesterol disorder screening at age 24 and every 3 years.  Also recommended:  1. Dental visit- Brush and floss your teeth twice daily; visit your dentist twice a year. 2. Eye doctor- Get an eye exam at least every 2 years. 3. Helmet use- Always wear a helmet when riding a bicycle, motorcycle, rollerblading or skateboarding. 4. Safe sex- If you may be exposed to sexually transmitted infections, use a condom. 5. Seat belts- Seat belts can save your live; always wear one. 6. Smoke/Carbon Monoxide detectors- These detectors need to be installed on the appropriate level of your home. Replace batteries at least once a year. 7. Skin cancer- When out in the sun please cover up and use sunscreen 15 SPF or higher. 8. Violence- If anyone is threatening or hurting you, please tell your healthcare provider.  9. Drink alcohol in moderation- Limit alcohol intake to one drink or less per day. Never drink and drive.  Please be sure medication list is accurate. If a new problem present, please set up appointment sooner than planned today.

## 2018-10-08 ENCOUNTER — Telehealth: Payer: Self-pay | Admitting: Family Medicine

## 2018-10-08 NOTE — Telephone Encounter (Signed)
Copied from CRM [id number]. Topic: Quick Communication - Lab Results (Clinic Use ONLY) >> Oct 07, 2018 10:32 AM Jobe GibbonJones, Quaneisha S, CMA wrote: Called patient to inform them of their lab results. When patient returns call, triage nurse may disclose results. >> Oct 08, 2018 12:30 PM Arlyss Gandyichardson, Pamelia Botto N, NT wrote: Pt returning call for lab results.

## 2018-10-08 NOTE — Telephone Encounter (Signed)
Lab results given and documented in result note
